# Patient Record
Sex: Male | Born: 1995 | Race: White | Hispanic: No | Marital: Single | Smoking: Current every day smoker
Health system: Southern US, Community
[De-identification: ages and names within clinical notes are randomized; demographics above are authoritative.]

## PROBLEM LIST (undated history)

## (undated) DIAGNOSIS — F4310 Post-traumatic stress disorder, unspecified: Secondary | ICD-10-CM

---
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
## 2017-05-14 ENCOUNTER — Encounter: Payer: Self-pay | Admitting: Emergency Medicine

## 2017-05-14 ENCOUNTER — Emergency Department
Admission: EM | Admit: 2017-05-14 | Discharge: 2017-05-14 | Disposition: A | Discharge status: Home / Self Care | Payer: Commercial Managed Care - PPO | Attending: Emergency Medicine | Admitting: Emergency Medicine

## 2017-05-14 ENCOUNTER — Other Ambulatory Visit: Payer: Self-pay

## 2017-05-14 ENCOUNTER — Emergency Department: Payer: Commercial Managed Care - PPO

## 2017-05-14 DIAGNOSIS — Y929 Unspecified place or not applicable: Secondary | ICD-10-CM | POA: Diagnosis not present

## 2017-05-14 DIAGNOSIS — X501XXA Overexertion from prolonged static or awkward postures, initial encounter: Secondary | ICD-10-CM | POA: Diagnosis not present

## 2017-05-14 DIAGNOSIS — Y999 Unspecified external cause status: Secondary | ICD-10-CM | POA: Diagnosis not present

## 2017-05-14 DIAGNOSIS — S82892A Other fracture of left lower leg, initial encounter for closed fracture: Secondary | ICD-10-CM | POA: Insufficient documentation

## 2017-05-14 DIAGNOSIS — Y939 Activity, unspecified: Secondary | ICD-10-CM | POA: Insufficient documentation

## 2017-05-14 DIAGNOSIS — S99912A Unspecified injury of left ankle, initial encounter: Secondary | ICD-10-CM | POA: Diagnosis present

## 2017-05-14 HISTORY — DX: Post-traumatic stress disorder, unspecified: F43.10

## 2017-05-14 MED ORDER — OXYCODONE-ACETAMINOPHEN 7.5-325 MG PO TABS: 1.0000 | ORAL_TABLET | Freq: Four times a day (QID) | ORAL | 0 refills | Status: AC | PRN

## 2017-05-14 MED ORDER — NAPROXEN 500 MG PO TABS: 500.0000 mg | ORAL_TABLET | Freq: Two times a day (BID) | ORAL | Status: AC

## 2017-05-14 MED ORDER — OXYCODONE-ACETAMINOPHEN 5-325 MG PO TABS
1.0000 | ORAL_TABLET | Freq: Once | ORAL | Status: AC
  Administered 2017-05-14: 1 via ORAL
  Filled 2017-05-14: qty 1

## 2017-05-14 MED ORDER — IBUPROFEN 800 MG PO TABS
800.0000 mg | ORAL_TABLET | Freq: Once | ORAL | Status: AC
  Administered 2017-05-14: 800 mg via ORAL
  Filled 2017-05-14: qty 1

## 2017-05-14 NOTE — ED Triage Notes (Signed)
Pt to ED via POV, pt states that he was standing moving stuff and miss stepped and rolled his ankle. Pt has swelling to the left ankle. Pt in NAD at this time.

## 2017-05-14 NOTE — Discharge Instructions (Signed)
Wear splint and ambulate with crutches until evaluation by orthopedics.  Call in the morning to schedule appointment.

## 2017-05-14 NOTE — ED Notes (Signed)
See triage note states he stepped wrong and rolled his ankle  Swelling noted to left ankle  positive pulses

## 2017-05-14 NOTE — ED Provider Notes (Signed)
[facility] Emergency Department Provider Note   ____________________________________________   First MD Initiated Contact with Patient 05/14/17 1619     (approximate)  I have reviewed the triage vital signs and the nursing notes.   HISTORY  Chief Complaint Ankle Injury    HPI Ryan Brown is a 22 y.o. male patient complain of left ankle pain secondary to missed a step and rolled his ankle prior to arrival.  Patient rates his pain as a 10/10.  Patient described the pain is "aching".  Patient unable to weight-bear secondary to pain and edema.  No palliative measures prior to arrival.   Past Medical History:  Diagnosis Date  . PTSD (post-traumatic stress disorder)     There are no active problems to display for this patient.   History reviewed. No pertinent surgical history.  Prior to Admission medications   Medication Sig Start Date End Date Taking? Authorizing Provider  naproxen (NAPROSYN) 500 MG tablet Take 1 tablet (500 mg total) by mouth 2 (two) times daily with a meal. 05/14/17   Joni Reining, PA-C  oxyCODONE-acetaminophen (PERCOCET) 7.5-325 MG tablet Take 1 tablet by mouth every 6 (six) hours as needed for severe pain. 05/14/17   Joni Reining, PA-C    Allergies Patient has no known allergies.  No family history on file.  Social History Social History   Tobacco Use  . Smoking status: Never Smoker  . Smokeless tobacco: Never Used  Substance Use Topics  . Alcohol use: Not Currently  . Drug use: Yes    Types: Marijuana    Review of Systems Constitutional: No fever/chills Eyes: No visual changes. ENT: No sore throat. Cardiovascular: Denies chest pain. Respiratory: Denies shortness of breath. Gastrointestinal: No abdominal pain.  No nausea, no vomiting.  No diarrhea.  No constipation. Genitourinary: Negative for dysuria. Musculoskeletal: Left lateral ankle pain Skin: Negative for rash. Neurological: Negative for  headaches, focal weakness or numbness.   ____________________________________________   PHYSICAL EXAM:  VITAL SIGNS: ED Triage Vitals  Enc Vitals Group     BP 05/14/17 1607 114/73     Pulse Rate 05/14/17 1607 89     Resp 05/14/17 1607 16     Temp 05/14/17 1607 (!) 97.4 F (36.3 C)     Temp Source 05/14/17 1607 Oral     SpO2 05/14/17 1607 97 %     Weight --      Height --      Head Circumference --      Peak Flow --      Pain Score 05/14/17 1609 10     Pain Loc --      Pain Edu? --      Excl. in GC? --    Constitutional: Alert and oriented. Well appearing and in no acute distress. Hematological/Lymphatic/Immunilogical No cervical lymphadenopathy. Cardiovascular: Normal rate, regular rhythm. Grossly normal heart sounds.  Good peripheral circulation. Respiratory: Normal respiratory effort.  No retractions. Lungs CTAB. Musculoskeletal: No obvious deformity to the left ankle.  Moderate edema to the lateral malleolus.  Patient refused weightbearing secondary to pain. Neurologic:  Normal speech and language. No gross focal neurologic deficits are appreciated. No gait instability. Skin:  Skin is warm, dry and intact. No rash noted. Psychiatric: Mood and affect are normal. Speech and behavior are normal.  ____________________________________________   LABS (all labs ordered are listed, but only abnormal results are displayed)  Labs Reviewed - No data to display ____________________________________________  EKG   ____________________________________________  RADIOLOGY    Official radiology report(s): Dg Ankle Complete Left  Result Date: 05/14/2017 CLINICAL DATA:  Ankle injury while moving furniture EXAM: LEFT ANKLE COMPLETE - 3+ VIEW COMPARISON:  None. FINDINGS: Lucency through the anterior aspect of the distal fibula is seen only on the lateral projection, possibly indicating a minimally displaced fracture. There is severe lateral softtissue swelling. No tibial  fracture. IMPRESSION: Severe lateral left ankle soft tissue swelling with suspected nondisplaced fracture of the distal fibula. CT would be confirmatory. Electronically Signed   By: Deatra RobinsonKevin  Herman M.D.   On: 05/14/2017 17:06    ____________________________________________   PROCEDURES  Procedure(s) performed: None  Procedures  Critical Care performed: No  ____________________________________________   INITIAL IMPRESSION / ASSESSMENT AND PLAN / ED COURSE  As part of my medical decision making, I reviewed the following data within the electronic MEDICAL RECORD NUMBER    Patient presents for left ankle pain secondary to "rolling ankle" prior to arrival.  X-ray findings per radiologist suggests of a distal nondisplaced fibular fracture.  Discussed x-ray findings with patient.  Patient placed in a short leg ankle splint and given crutches for ambulation.  Patient given prescription for Percocet and naproxen.  Advised patient follow-up on-call orthopedic department in the morning to schedule appointment for definitive evaluation and treatment.      ____________________________________________   FINAL CLINICAL IMPRESSION(S) / ED DIAGNOSES  Final diagnoses:  Closed fracture of left ankle, initial encounter     ED Discharge Orders        Ordered    oxyCODONE-acetaminophen (PERCOCET) 7.5-325 MG tablet  Every 6 hours PRN     05/14/17 1726    naproxen (NAPROSYN) 500 MG tablet  2 times daily with meals     05/14/17 1726       Note:  This document was prepared using Dragon voice recognition software and may include unintentional dictation errors.    Joni ReiningSmith, Ronald K, PA-C 05/14/17 1732    Phineas SemenGoodman, Graydon, MD 05/14/17 [phone]

## 2018-04-12 ENCOUNTER — Other Ambulatory Visit: Payer: Self-pay

## 2018-04-12 ENCOUNTER — Emergency Department
Admission: EM | Admit: 2018-04-12 | Discharge: 2018-04-12 | Disposition: A | Discharge status: Home / Self Care | Payer: Commercial Managed Care - PPO | Attending: Emergency Medicine | Admitting: Emergency Medicine

## 2018-04-12 ENCOUNTER — Emergency Department: Payer: Commercial Managed Care - PPO

## 2018-04-12 ENCOUNTER — Encounter: Payer: Self-pay | Admitting: Emergency Medicine

## 2018-04-12 DIAGNOSIS — Y929 Unspecified place or not applicable: Secondary | ICD-10-CM | POA: Diagnosis not present

## 2018-04-12 DIAGNOSIS — S93401A Sprain of unspecified ligament of right ankle, initial encounter: Secondary | ICD-10-CM | POA: Insufficient documentation

## 2018-04-12 DIAGNOSIS — Y999 Unspecified external cause status: Secondary | ICD-10-CM | POA: Insufficient documentation

## 2018-04-12 DIAGNOSIS — Z79899 Other long term (current) drug therapy: Secondary | ICD-10-CM | POA: Insufficient documentation

## 2018-04-12 DIAGNOSIS — F17200 Nicotine dependence, unspecified, uncomplicated: Secondary | ICD-10-CM | POA: Diagnosis not present

## 2018-04-12 DIAGNOSIS — Y9301 Activity, walking, marching and hiking: Secondary | ICD-10-CM | POA: Insufficient documentation

## 2018-04-12 DIAGNOSIS — S99911A Unspecified injury of right ankle, initial encounter: Secondary | ICD-10-CM | POA: Diagnosis present

## 2018-04-12 DIAGNOSIS — W010XXA Fall on same level from slipping, tripping and stumbling without subsequent striking against object, initial encounter: Secondary | ICD-10-CM | POA: Diagnosis not present

## 2018-04-12 DIAGNOSIS — S93601A Unspecified sprain of right foot, initial encounter: Secondary | ICD-10-CM | POA: Diagnosis not present

## 2018-04-12 MED ORDER — BACITRACIN-NEOMYCIN-POLYMYXIN 400-5-5000 EX OINT
TOPICAL_OINTMENT | Freq: Once | CUTANEOUS | Status: AC
  Administered 2018-04-12: 19:00:00 via TOPICAL

## 2018-04-12 MED ORDER — NAPROXEN 500 MG PO TABS
500.0000 mg | ORAL_TABLET | Freq: Once | ORAL | Status: AC
  Administered 2018-04-12: 500 mg via ORAL
  Filled 2018-04-12: qty 1

## 2018-04-12 MED ORDER — TRAMADOL HCL 50 MG PO TABS: 50.0000 mg | ORAL_TABLET | Freq: Four times a day (QID) | ORAL | 0 refills | Status: AC | PRN

## 2018-04-12 MED ORDER — IBUPROFEN 600 MG PO TABS: 600.0000 mg | ORAL_TABLET | Freq: Three times a day (TID) | ORAL | 0 refills | Status: AC | PRN

## 2018-04-12 MED ORDER — BACITRACIN-NEOMYCIN-POLYMYXIN 400-5-5000 EX OINT
TOPICAL_OINTMENT | CUTANEOUS | Status: AC
  Filled 2018-04-12: qty 1

## 2018-04-12 MED ORDER — BACITRACIN ZINC 500 UNIT/GM EX OINT: TOPICAL_OINTMENT | Freq: Once | CUTANEOUS | Status: DC

## 2018-04-12 NOTE — ED Triage Notes (Signed)
First Nurse Note:  Patient states he was walking his dog and his dog tripped him and now he is having foot pain.  Patient ambulatory with a cane.

## 2018-04-12 NOTE — ED Triage Notes (Signed)
Pt presents with right foot and ankle pain after a fall when walking his dog. Pt also has abrasion to right knee.

## 2018-04-12 NOTE — ED Provider Notes (Signed)
[facility] Emergency Department Provider Note   ____________________________________________   First MD Initiated Contact with Patient 04/12/18 1805     (approximate)  I have reviewed the triage vital signs and the nursing notes.   HISTORY  Chief Complaint Ankle Pain    HPI Ryan Brown is a 23 y.o. male patient presents with right foot and ankle pain secondary to a trip and fall while walking his dog.  Patient also abrasion to the right knee.  Patient is able to weight-bear.  Patient rates his pain as a 7/10.  Patient described the pain is "achy".  No palliative measures for complaint.    Past Medical History:  Diagnosis Date  . PTSD (post-traumatic stress disorder)     There are no active problems to display for this patient.   History reviewed. No pertinent surgical history.  Prior to Admission medications   Medication Sig Start Date End Date Taking? Authorizing Provider  ibuprofen (ADVIL,MOTRIN) 600 MG tablet Take 1 tablet (600 mg total) by mouth every 8 (eight) hours as needed. 04/12/18   Joni Reining, PA-C  naproxen (NAPROSYN) 500 MG tablet Take 1 tablet (500 mg total) by mouth 2 (two) times daily with a meal. 05/14/17   Joni Reining, PA-C  oxyCODONE-acetaminophen (PERCOCET) 7.5-325 MG tablet Take 1 tablet by mouth every 6 (six) hours as needed for severe pain. 05/14/17   Joni Reining, PA-C  traMADol (ULTRAM) 50 MG tablet Take 1 tablet (50 mg total) by mouth every 6 (six) hours as needed. 04/12/18   Joni Reining, PA-C    Allergies Patient has no known allergies.  History reviewed. No pertinent family history.  Social History Social History   Tobacco Use  . Smoking status: Current Every Day Smoker    Packs/day: 0.50  . Smokeless tobacco: Never Used  Substance Use Topics  . Alcohol use: Yes    Alcohol/week: 7.0 standard drinks    Types: 7 Cans of beer per week  . Drug use: Not Currently    Types: Marijuana   Comment: occasional    Review of Systems  Constitutional: No fever/chills Eyes: No visual changes. ENT: No sore throat. Cardiovascular: Denies chest pain. Respiratory: Denies shortness of breath. Gastrointestinal: No abdominal pain.  No nausea, no vomiting.  No diarrhea.  No constipation. Genitourinary: Negative for dysuria. Musculoskeletal: Right ankle and foot pain. Skin: Negative for rash. Neurological: Negative for headaches, focal weakness or numbness. Psychiatric:  PTSD. ____________________________________________   PHYSICAL EXAM:  VITAL SIGNS: ED Triage Vitals  Enc Vitals Group     BP      Pulse      Resp      Temp      Temp src      SpO2      Weight      Height      Head Circumference      Peak Flow      Pain Score      Pain Loc      Pain Edu?      Excl. in GC?    Constitutional: Alert and oriented. Well appearing and in no acute distress.  Morbid obesity. Neck: No stridor.  No cervical spine tenderness to palpation. Hematological/Lymphatic/Immunilogical: No cervical lymphadenopathy. Cardiovascular: Normal rate, regular rhythm. Grossly normal heart sounds.  Good peripheral circulation. Respiratory: Normal respiratory effort.  No retractions. Lungs CTAB. Gastrointestinal: Soft and nontender. No distention. No abdominal bruits. No CVA tenderness. Musculoskeletal: No obvious  deformity to the right ankle/foot.  Patient is moderate guarding palpation lateral aspect of the right foot.   Neurologic:  Normal speech and language. No gross focal neurologic deficits are appreciated. No gait instability. Skin:  Skin is warm, dry and intact. No rash noted. Psychiatric: Mood and affect are normal. Speech and behavior are normal.  ____________________________________________   LABS (all labs ordered are listed, but only abnormal results are displayed)  Labs Reviewed - No data to  display ____________________________________________  EKG   ____________________________________________  RADIOLOGY  ED MD interpretation:    Official radiology report(s): Dg Foot Complete Right  Result Date: 04/12/2018 CLINICAL DATA:  Foot pain after falling. EXAM: RIGHT FOOT COMPLETE - 3+ VIEW COMPARISON:  12/13/2008 FINDINGS: There is no evidence of fracture or dislocation. There is no evidence of arthropathy or other focal bone abnormality. Soft tissues are unremarkable. IMPRESSION: Negative. Electronically Signed   By: Kennith Center M.D.   On: 04/12/2018 18:44    ____________________________________________   PROCEDURES  Procedure(s) performed (including Critical Care):  Procedures   ____________________________________________   INITIAL IMPRESSION / ASSESSMENT AND PLAN / ED COURSE  As part of my medical decision making, I reviewed the following data within the electronic MEDICAL RECORD NUMBER     Right foot and ankle pain secondary to sprain.  Discussed x-ray findings with patient.  Patient placed in ankle stirrup and postop shoe.  Patient given discharge care instruction advised take medication as directed.  [facility]      ____________________________________________   FINAL CLINICAL IMPRESSION(S) / ED DIAGNOSES  Final diagnoses:  Sprain of right ankle, unspecified ligament, initial encounter  Sprain of right foot, initial encounter     ED Discharge Orders         Ordered    traMADol (ULTRAM) 50 MG tablet  Every 6 hours PRN     04/12/18 1855    ibuprofen (ADVIL,MOTRIN) 600 MG tablet  Every 8 hours PRN     04/12/18 1855           Note:  This document was prepared using Dragon voice recognition software and may include unintentional dictation errors.    Joni Reining, PA-C 04/12/18 1901    Dionne Bucy, MD 04/12/18 [phone]

## 2020-05-13 IMAGING — DX DG FOOT COMPLETE 3+V*R*
3 series · 3 of 3 positions shown · non-contrast
Comparison: 12/13/2008

CLINICAL DATA: Foot pain after falling.

EXAM:
RIGHT FOOT COMPLETE - 3+ VIEW

[foot ap]
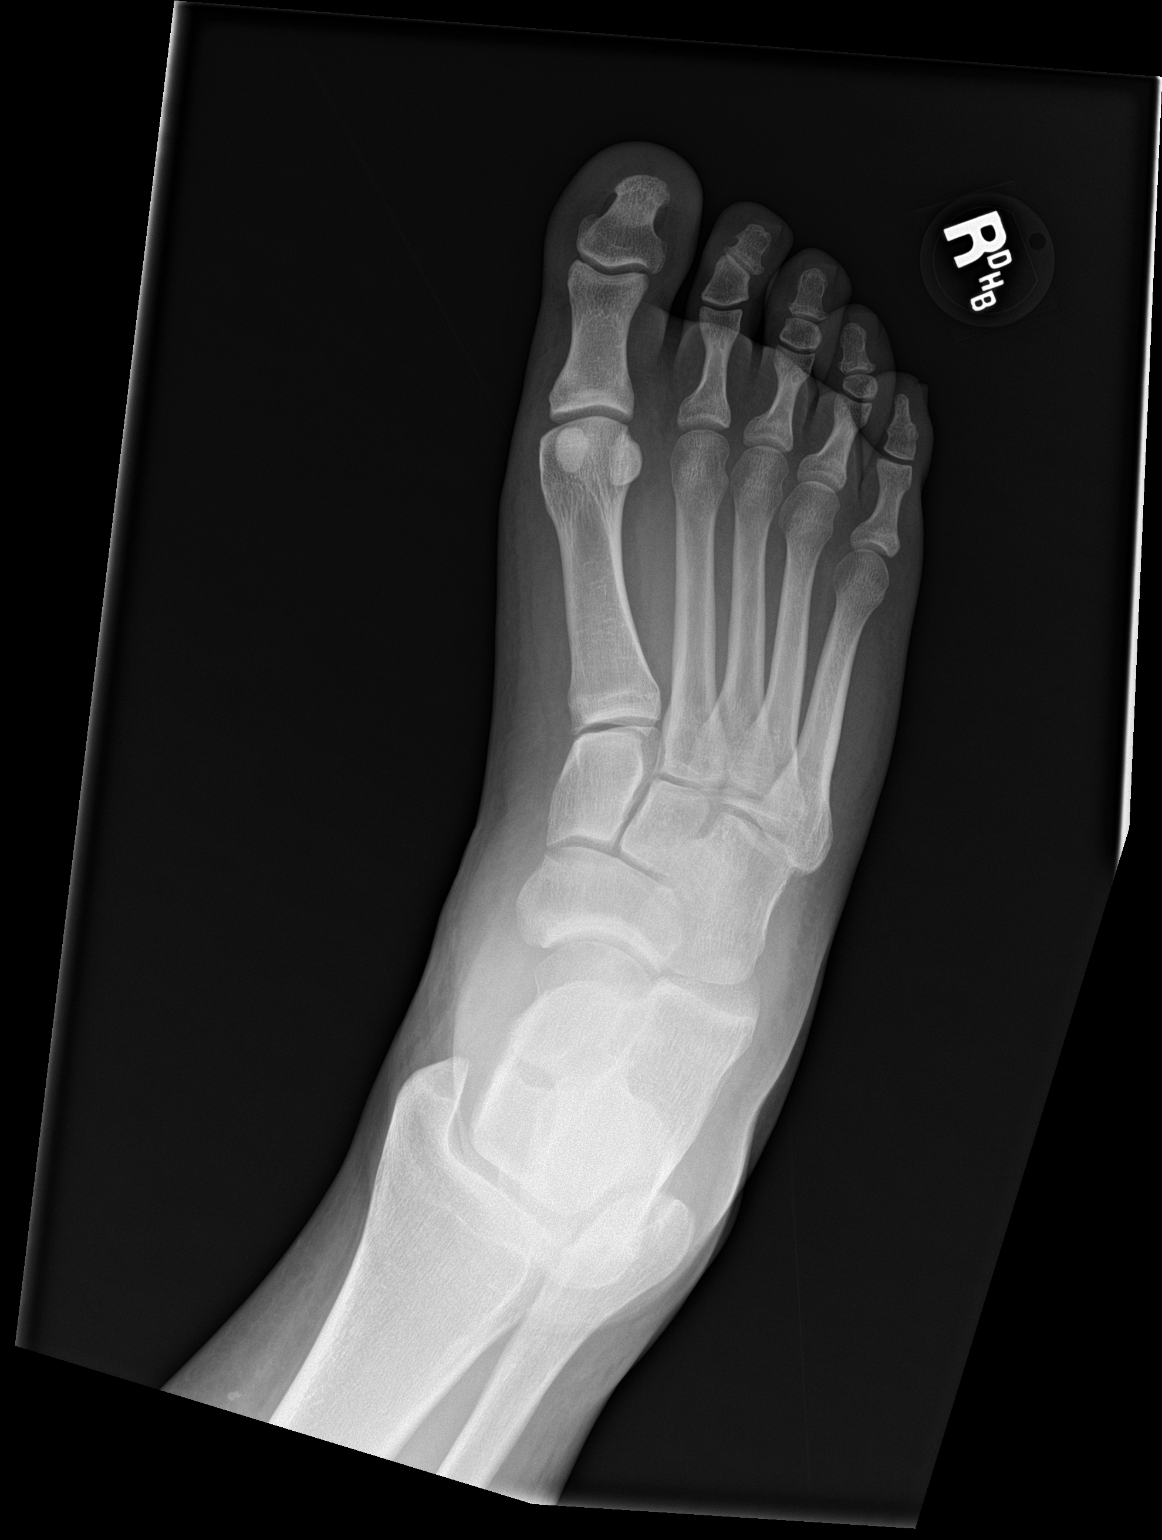

[foot obl]
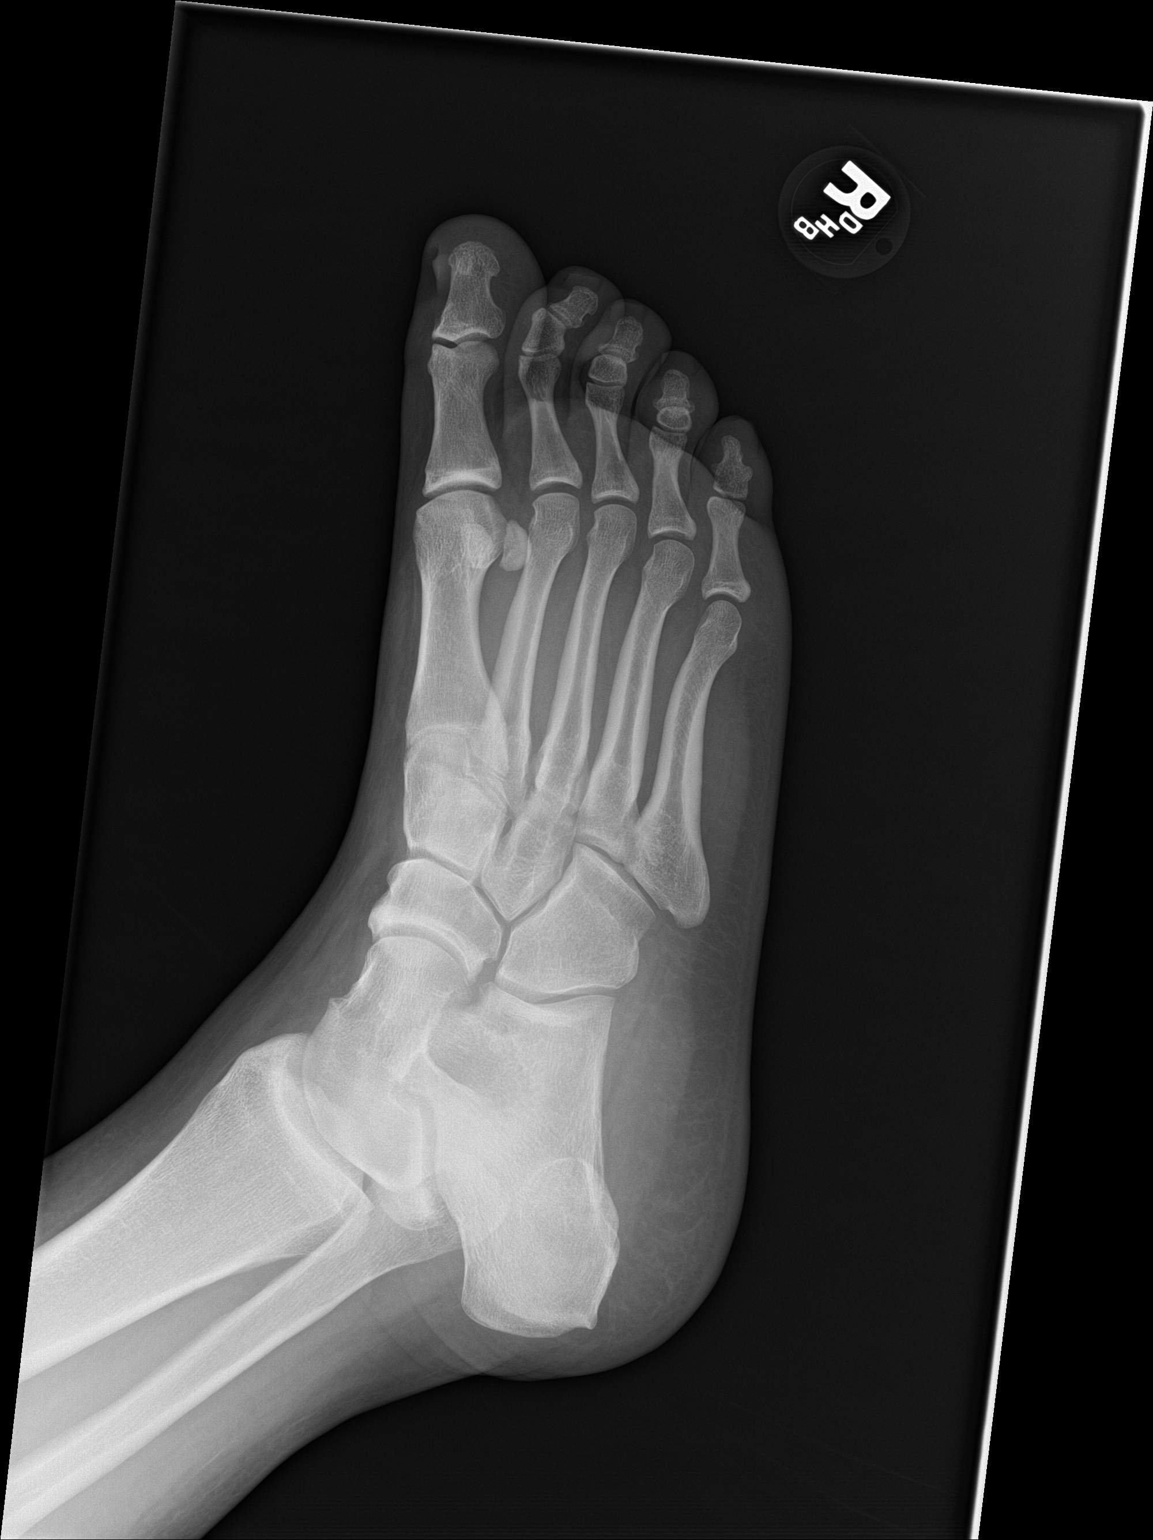

[foot lat]
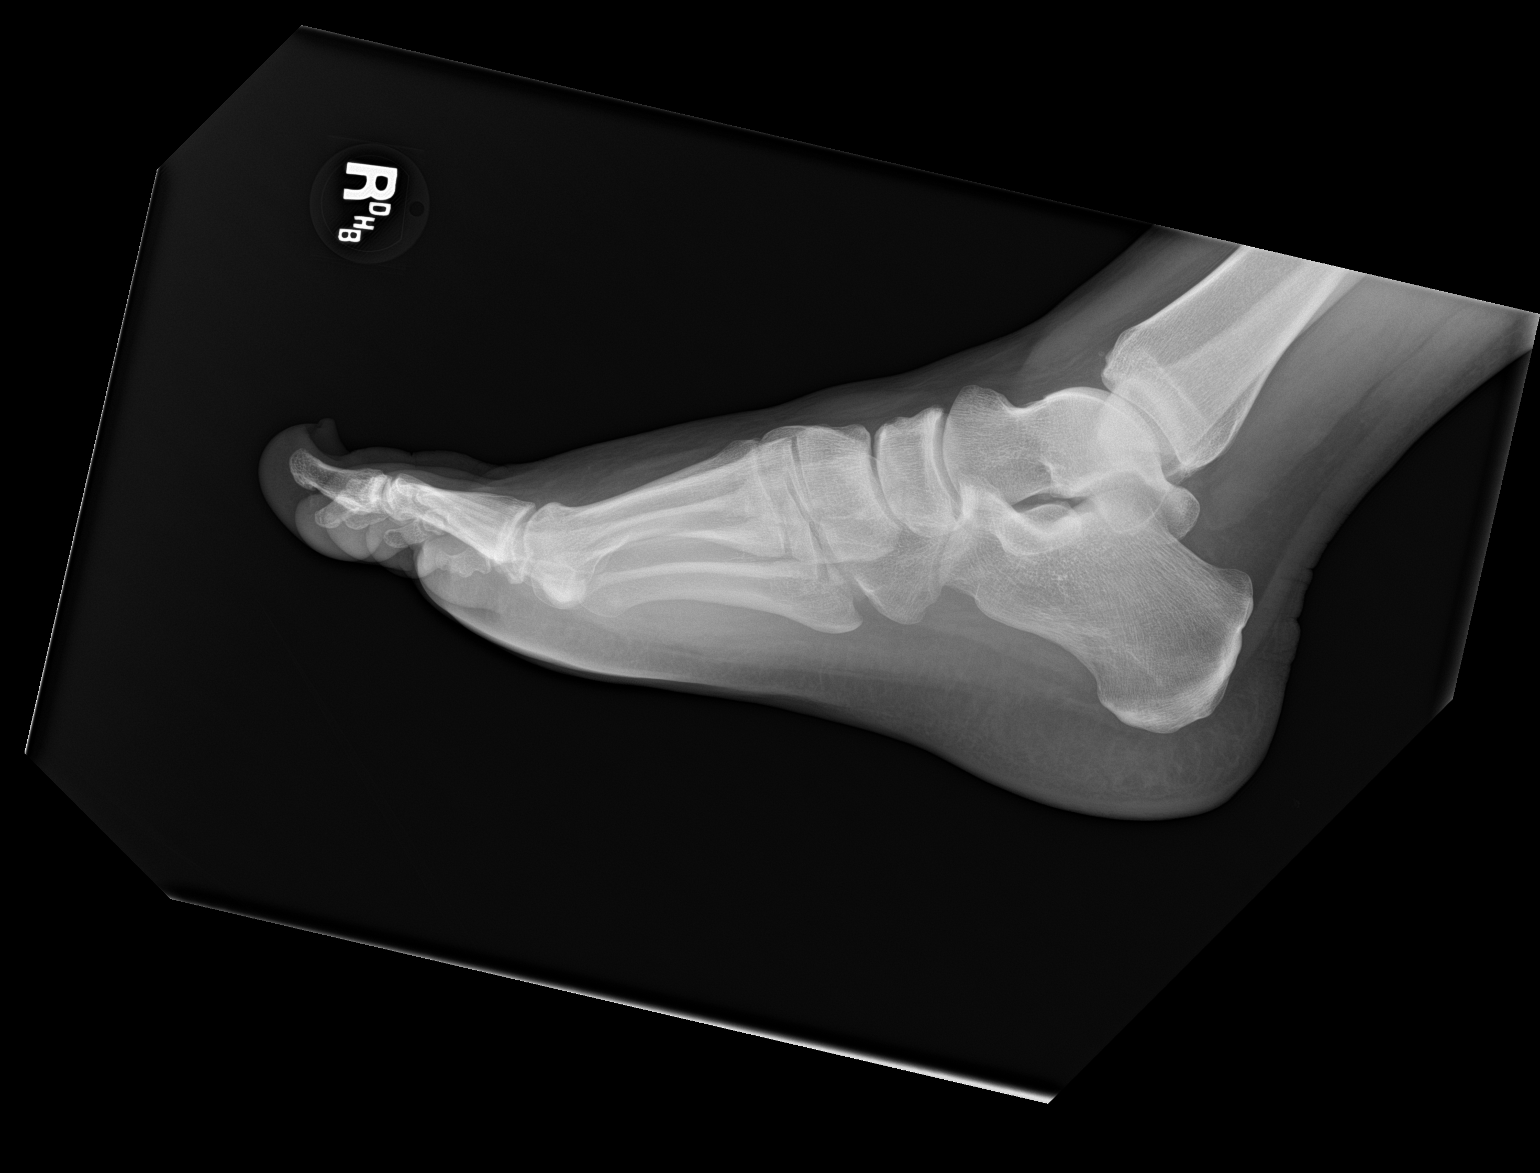

[3 of 3 positions shown; findings below may reference images not displayed]

FINDINGS: There is no evidence of fracture or dislocation. There is no
evidence of arthropathy or other focal bone abnormality. Soft
tissues are unremarkable.
IMPRESSION: Negative.

## 2022-08-03 ENCOUNTER — Other Ambulatory Visit: Payer: Self-pay

## 2022-08-03 ENCOUNTER — Emergency Department: Payer: Self-pay

## 2022-08-03 ENCOUNTER — Emergency Department
Admission: EM | Admit: 2022-08-03 | Discharge: 2022-08-03 | Disposition: A | Discharge status: Home / Self Care | Payer: Self-pay | Attending: Emergency Medicine | Admitting: Emergency Medicine

## 2022-08-03 DIAGNOSIS — M25572 Pain in left ankle and joints of left foot: Secondary | ICD-10-CM | POA: Insufficient documentation

## 2022-08-03 DIAGNOSIS — M25472 Effusion, left ankle: Secondary | ICD-10-CM | POA: Insufficient documentation

## 2022-08-03 MED ORDER — MELOXICAM 15 MG PO TABS: 15.0000 mg | ORAL_TABLET | Freq: Every day | ORAL | 0 refills | Status: AC

## 2022-08-03 MED ORDER — ACETAMINOPHEN 500 MG PO TABS
1000.0000 mg | ORAL_TABLET | Freq: Once | ORAL | Status: AC
  Administered 2022-08-03: 1000 mg via ORAL
  Filled 2022-08-03: qty 2

## 2022-08-03 NOTE — ED Notes (Signed)
See triage note. Patient came in due to working 12 hour shifts and this is the second time his ankle has swollen up after being on his feet for the entire 12 hours at work. Rating pain 10/10 and stabbing pain.

## 2022-08-03 NOTE — ED Notes (Signed)
Patient given CAM boot L and crutches M at this time. Paperwork filled out also.

## 2022-08-03 NOTE — ED Provider Notes (Signed)
[facility] Provider Note    Event Date/Time   First MD Initiated Contact with Patient 08/03/22 1320     (approximate)   History   Ankle Pain   HPI  Ryan Brown is a 27 y.o. male with PMH of a closed fracture of the left ankle who presents for left ankle pain x 1 week.  Patient states that his pain became worse last night which is what brought him in this morning.  He has been wearing a walking boot that he borrowed from a friend which has helped a little bit.  Patient has been taking ibuprofen at home but does not get sufficient pain relief.  Patient is an International aid/development worker at [employer] and states he has been working a lot in the last few days.  He works 12-hour shifts and is standing majority of the time.  He denies any specific injury to the ankle.     Physical Exam   Triage Vital Signs: ED Triage Vitals  Enc Vitals Group     BP 08/03/22 1223 (!) 132/96     Pulse Rate 08/03/22 1223 88     Resp 08/03/22 1223 16     Temp 08/03/22 1223 98.5 F (36.9 C)     Temp Source 08/03/22 1223 Oral     SpO2 08/03/22 1223 97 %     Weight 08/03/22 1224 243 lb (110.2 kg)     Height 08/03/22 1224 5\' 9"  (1.753 m)     Head Circumference --      Peak Flow --      Pain Score 08/03/22 1226 10     Pain Loc --      Pain Edu? --      Excl. in GC? --     Most recent vital signs: Vitals:   08/03/22 1223 08/03/22 1331  BP: (!) 132/96 (!) 91/58  Pulse: 88 65  Resp: 16 17  Temp: 98.5 F (36.9 C) 98.6 F (37 C)  SpO2: 97% 98%    General: Awake, tearful on exam. CV:  Good peripheral perfusion.   Resp:  Normal effort.  Abd:  No distention.  L ankle: Swelling noted when compared to the right ankle, tender to palpation at the joint line as well as surrounding the medial and lateral malleolus, ROM significantly decreased due to pain, patient unable to complete strength testing due to pain. Dorsalis pedis pulse difficult to palpate.  Capillary refill less than  2 seconds.   ED Results / Procedures / Treatments   Labs (all labs ordered are listed, but only abnormal results are displayed) Labs Reviewed - No data to display  RADIOLOGY  Left ankle x-rays obtained in the ED, I interpreted the images as well as reviewed the radiologist report.   PROCEDURES:  Critical Care performed: No  Procedures   MEDICATIONS ORDERED IN ED: Medications  acetaminophen (TYLENOL) tablet 1,000 mg (has no administration in time range)     IMPRESSION / MDM / ASSESSMENT AND PLAN / ED COURSE  I reviewed the triage vital signs and the nursing notes.                              Differential diagnosis includes, but is not limited to, ankle fracture, ankle sprain, arthritis.  Patient's presentation is most consistent with acute complicated illness / injury requiring diagnostic workup.  Patient with history of multiple ankle injuries presents to  the ED for evaluation of ankle pain x 1 week.  On exam patient was exquisitely tender around the ankle joint.  Ankle x-rays were obtained.  I interpreted the images as well as reviewed the radiologist report.  There is no evidence of fracture or dislocation, but multiple sites of degenerative spurring as well as soft tissue edema.  I believe patient's pain is due to an increase in his activity which is caused inflammation in the ankle.  I will give patient a walking boot and crutches for comfort.  I also explained that he needs to ice and elevate his ankle is much as possible.  I will include a work note allowing for breaks so patient can elevate his ankle.  I also instructed patient to follow-up with orthopedics.  I will give patient a prescription for meloxicam and instructed him to take Tylenol if he needs additional pain control.  Patient was agreeable to plan, he voiced understanding and all questions were answered.  Patient was stable at discharge.     FINAL CLINICAL IMPRESSION(S) / ED DIAGNOSES   Final diagnoses:   Acute left ankle pain     Rx / DC Orders   ED Discharge Orders          Ordered    meloxicam (MOBIC) 15 MG tablet  Daily        08/03/22 1415             Note:  This document was prepared using Dragon voice recognition software and may include unintentional dictation errors.   Cameron Ali, PA-C 08/03/22 1416    Chesley Noon, MD 08/03/22 [phone]

## 2022-08-03 NOTE — ED Triage Notes (Signed)
Pt to ED for left ankle pain for 1 week. Worsening pain last night. Reports injury a couple years ago  Tearful in triage

## 2022-10-16 DIAGNOSIS — Z419 Encounter for procedure for purposes other than remedying health state, unspecified: Secondary | ICD-10-CM | POA: Diagnosis not present

## 2022-11-15 DIAGNOSIS — Z419 Encounter for procedure for purposes other than remedying health state, unspecified: Secondary | ICD-10-CM | POA: Diagnosis not present

## 2022-12-16 DIAGNOSIS — Z419 Encounter for procedure for purposes other than remedying health state, unspecified: Secondary | ICD-10-CM | POA: Diagnosis not present

## 2023-01-15 DIAGNOSIS — Z419 Encounter for procedure for purposes other than remedying health state, unspecified: Secondary | ICD-10-CM | POA: Diagnosis not present

## 2023-02-15 DIAGNOSIS — Z419 Encounter for procedure for purposes other than remedying health state, unspecified: Secondary | ICD-10-CM | POA: Diagnosis not present

## 2023-03-18 DIAGNOSIS — Z419 Encounter for procedure for purposes other than remedying health state, unspecified: Secondary | ICD-10-CM | POA: Diagnosis not present

## 2023-04-15 DIAGNOSIS — Z419 Encounter for procedure for purposes other than remedying health state, unspecified: Secondary | ICD-10-CM | POA: Diagnosis not present

## 2023-05-27 DIAGNOSIS — Z419 Encounter for procedure for purposes other than remedying health state, unspecified: Secondary | ICD-10-CM | POA: Diagnosis not present

## 2023-06-25 ENCOUNTER — Other Ambulatory Visit: Payer: Self-pay

## 2023-06-25 ENCOUNTER — Emergency Department: Admission: EM | Admit: 2023-06-25 | Discharge: 2023-06-25 | Disposition: A | Discharge status: Home / Self Care

## 2023-06-25 DIAGNOSIS — E860 Dehydration: Secondary | ICD-10-CM | POA: Diagnosis not present

## 2023-06-25 DIAGNOSIS — U071 COVID-19: Secondary | ICD-10-CM | POA: Insufficient documentation

## 2023-06-25 DIAGNOSIS — R112 Nausea with vomiting, unspecified: Secondary | ICD-10-CM

## 2023-06-25 DIAGNOSIS — R197 Diarrhea, unspecified: Secondary | ICD-10-CM | POA: Diagnosis present

## 2023-06-25 LAB — URINALYSIS, ROUTINE W REFLEX MICROSCOPIC
Bilirubin Urine: NEGATIVE
Glucose, UA: NEGATIVE mg/dL
Hgb urine dipstick: NEGATIVE
Ketones, ur: NEGATIVE mg/dL
Leukocytes,Ua: NEGATIVE
Nitrite: NEGATIVE
Protein, ur: NEGATIVE mg/dL
Specific Gravity, Urine: 1.025 (ref 1.005–1.030)
pH: 5 (ref 5.0–8.0)

## 2023-06-25 LAB — COMPREHENSIVE METABOLIC PANEL WITH GFR
ALT: 35 U/L (ref 0–44)
AST: 30 U/L (ref 15–41)
Albumin: 4.2 g/dL (ref 3.5–5.0)
Alkaline Phosphatase: 50 U/L (ref 38–126)
Anion gap: 9 (ref 5–15)
BUN: 11 mg/dL (ref 6–20)
CO2: 21 mmol/L — ABNORMAL LOW (ref 22–32)
Calcium: 8.8 mg/dL — ABNORMAL LOW (ref 8.9–10.3)
Chloride: 106 mmol/L (ref 98–111)
Creatinine, Ser: 0.88 mg/dL (ref 0.61–1.24)
GFR, Estimated: >60 mL/min (ref >60)
Glucose, Bld: 107 mg/dL — ABNORMAL HIGH (ref 70–99)
Potassium: 3.5 mmol/L (ref 3.5–5.1)
Sodium: 136 mmol/L (ref 135–145)
Total Bilirubin: 0.6 mg/dL (ref 0.0–1.2)
Total Protein: 7.7 g/dL (ref 6.5–8.1)

## 2023-06-25 LAB — CBC
HCT: 45.7 % (ref 39.0–52.0)
Hemoglobin: 15.7 g/dL (ref 13.0–17.0)
MCH: 32.9 pg (ref 26.0–34.0)
MCHC: 34.4 g/dL (ref 30.0–36.0)
MCV: 95.8 fL (ref 80.0–100.0)
Platelets: 251 10*3/uL (ref 150–400)
RBC: 4.77 MIL/uL (ref 4.22–5.81)
RDW: 12.5 % (ref 11.5–15.5)
WBC: 9.4 10*3/uL (ref 4.0–10.5)
nRBC: 0 % (ref 0.0–0.2)

## 2023-06-25 LAB — RESP PANEL BY RT-PCR (RSV, FLU A&B, COVID)  RVPGX2
Influenza A by PCR: NEGATIVE
Influenza B by PCR: NEGATIVE
Resp Syncytial Virus by PCR: NEGATIVE
SARS Coronavirus 2 by RT PCR: POSITIVE — AB

## 2023-06-25 LAB — LIPASE, BLOOD: Lipase: 35 U/L (ref 11–51)

## 2023-06-25 LAB — GROUP A STREP BY PCR: Group A Strep by PCR: NOT DETECTED

## 2023-06-25 MED ORDER — ONDANSETRON 4 MG PO TBDP: 4.0000 mg | ORAL_TABLET | Freq: Three times a day (TID) | ORAL | 0 refills | Status: AC | PRN

## 2023-06-25 MED ORDER — ONDANSETRON 4 MG PO TBDP
4.0000 mg | ORAL_TABLET | Freq: Once | ORAL | Status: AC | PRN
  Administered 2023-06-25: 4 mg via ORAL
  Filled 2023-06-25: qty 1

## 2023-06-25 MED ORDER — KETOROLAC TROMETHAMINE 15 MG/ML IJ SOLN
15.0000 mg | Freq: Once | INTRAMUSCULAR | Status: AC
  Administered 2023-06-25: 15 mg via INTRAVENOUS
  Filled 2023-06-25: qty 1

## 2023-06-25 MED ORDER — SODIUM CHLORIDE 0.9 % IV BOLUS
1000.0000 mL | Freq: Once | INTRAVENOUS | Status: AC
  Administered 2023-06-25: 1000 mL via INTRAVENOUS

## 2023-06-25 NOTE — ED Triage Notes (Signed)
 Pt c/o diarrhea and vomiting since 1030. Pt says now he feels really weak and faint. Pt reports 4 episodes of emesis and 5 episodes of emesis. Pt says he is unable to keep down water. Pt denies any sick contacts.

## 2023-06-25 NOTE — ED Provider Notes (Signed)
 [facility] Emergency Department Provider Note     Event Date/Time   First MD Initiated Contact with Patient 06/25/23 1916     (approximate)   History   Emesis and Diarrhea   HPI  Ryan Ryan [name] is Ryan [name] 28 y.o. male with Ryan [name] past medical history of PTSD presents to the ED with acute onset of diarrhea and vomiting that began earlier this morning.  Reports multiple episodes of both and is now feeling weak, lightheaded and " feeling weird".  Patient reports he is unable to tolerate oral intake.  Denies known sick contacts.  Denies chest pain, shortness of breath and abdominal pain.  Also complains of sore throat when he coughs.     Physical Exam   Triage Vital Signs: ED Triage Vitals  Encounter Vitals Group     BP 06/25/23 1756 (!) 143/97     Systolic BP Percentile --      Diastolic BP Percentile --      Pulse Rate 06/25/23 1756 (!) 118     Resp 06/25/23 1756 20     Temp 06/25/23 1756 98.6 F (37 C)     Temp Source 06/25/23 1756 Oral     SpO2 06/25/23 1756 98 %     Weight 06/25/23 1756 268 lb (121.6 kg)     Height 06/25/23 1756 5\' 9"  (1.753 m)     Head Circumference --      Peak Flow --      Pain Score 06/25/23 1755 5     Pain Loc --      Pain Education --      Exclude from Growth Chart --     Most recent vital signs: Vitals:   06/25/23 1756  BP: (!) 143/97  Pulse: (!) 118  Resp: 20  Temp: 98.6 F (37 C)  SpO2: 98%    General: Alert and oriented. INAD.  Skin:  Warm, dry and intact. No rashes or lesions noted. Good skin turgor Head:  NCAT.  Eyes:  PERRLA. EOMI.  Nose:   Mucosa is moist. No rhinorrhea. Throat: Oropharynx clear. No erythema or exudates. Tonsils not enlarged. Uvula is midline. CV:  Good peripheral perfusion. Tachy.  RESP:  Normal effort. LCTAB. No retractions.  ABD:  No distention.  NEURO: Cranial nerves intact. No focal deficits.  Brisk capillary refill.  ED Results / Procedures / Treatments   Labs (all labs  ordered are listed, but only abnormal results are displayed) Labs Reviewed  RESP PANEL BY RT-PCR (RSV, FLU Ryan [name]&B, COVID)  RVPGX2 - Abnormal; Notable for the following components:      Result Value   SARS Coronavirus 2 by RT PCR POSITIVE (*)    All other components within normal limits  COMPREHENSIVE METABOLIC PANEL WITH GFR - Abnormal; Notable for the following components:   CO2 21 (*)    Glucose, Bld 107 (*)    Calcium 8.8 (*)    All other components within normal limits  URINALYSIS, ROUTINE W REFLEX MICROSCOPIC - Abnormal; Notable for the following components:   Color, Urine YELLOW (*)    APPearance CLEAR (*)    All other components within normal limits  GROUP Ryan [name] STREP BY PCR  LIPASE, BLOOD  CBC    No results found.  PROCEDURES:  Critical Care performed:   Procedures  MEDICATIONS ORDERED IN ED: Medications  ondansetron (ZOFRAN-ODT) disintegrating tablet 4 mg (4 mg Oral Given 06/25/23 1759)  sodium chloride 0.9 % bolus 1,000 mL (  1,000 mLs Intravenous New Bag/Given 06/25/23 1949)  ketorolac (TORADOL) 15 MG/ML injection 15 mg (15 mg Intravenous Given 06/25/23 1948)    IMPRESSION / MDM / ASSESSMENT AND PLAN / ED COURSE  I reviewed the triage vital signs and the nursing notes.                                 28 y.o. male presents to the emergency department for evaluation and treatment of acute vomiting and diarrhea. See HPI for further details.   Differential diagnosis includes, but is not limited to viral URI, dehydration, gastroenteritis  Patient's presentation is most consistent with acute complicated illness / injury requiring diagnostic workup.  Patient presents to the ED with flulike symptoms including vomiting and diarrhea resulting in volume depletion and weakness.  No signs of respiratory distress or severe systemic illness.  No red flag signs.  Plan to obtain IV fluids for rehydration and antiemetic.  Will add respiratory panel and rapid strep test given sore throat  history.  Respiratory panel is positive for COVID-19 which is more than likely why patient is initially tachycardic.  Will prescribe outpatient Zofran for nausea.  Encourage clear liquids and advance diet as tolerated.  Strict return precautions discussed.  Discussed isolation and quarantine protocol per CDC guidelines.  Patient verbalized understanding.  Encouraged to follow-up with primary care provider in 1 week.  Patient is in stable condition for out patient management all questions and concerns were addressed during this ED visit.  Work note provided.  FINAL CLINICAL IMPRESSION(S) / ED DIAGNOSES   Final diagnoses:  COVID-19  Dehydration  Nausea vomiting and diarrhea     Rx / DC Orders   ED Discharge Orders     None       Note:  This document was prepared using Dragon voice recognition software and may include unintentional dictation errors.    Ryan Ryan [name], Ryan Maiden A, PA-C 06/25/23 2135    Kandee Orion, MD 06/25/23 2249

## 2023-06-25 NOTE — Discharge Instructions (Addendum)
 You were seen in the emergency department today for a vomiting and diarrhea. Your respiratory panel which includes COVID, RSV and influenza is positive for COVID-19.  You will need to follow protocols from the CDC and quarantine and isolation until symptoms improved.  You are vies to wear a mask when you are out in public and social distance at least 6 feet.    Your rapid strep test is negative.  Your labs are reassuring.  Take tylenol  or ibuprofen  for pain or fever as directed.   Stay hydrated by drinking plenty of fluids to thin mucus. Get adequate amount of sleep and avoid overexertion. Consider a humidifier at night. Warm teas and a spoonful of honey may help reduce cough frequency. Follow up with your primary care provider as needed.   For sore throat use throat lozenges or Chloraseptic spray.  Gargle with warm salt water several times daily

## 2023-06-26 DIAGNOSIS — Z419 Encounter for procedure for purposes other than remedying health state, unspecified: Secondary | ICD-10-CM | POA: Diagnosis not present

## 2023-07-27 DIAGNOSIS — Z419 Encounter for procedure for purposes other than remedying health state, unspecified: Secondary | ICD-10-CM | POA: Diagnosis not present

## 2023-07-31 DIAGNOSIS — S93401A Sprain of unspecified ligament of right ankle, initial encounter: Secondary | ICD-10-CM | POA: Diagnosis not present

## 2023-07-31 DIAGNOSIS — X509XXA Other and unspecified overexertion or strenuous movements or postures, initial encounter: Secondary | ICD-10-CM | POA: Diagnosis not present

## 2023-07-31 DIAGNOSIS — M25571 Pain in right ankle and joints of right foot: Secondary | ICD-10-CM | POA: Diagnosis not present

## 2023-08-26 DIAGNOSIS — Z419 Encounter for procedure for purposes other than remedying health state, unspecified: Secondary | ICD-10-CM | POA: Diagnosis not present

## 2023-09-26 DIAGNOSIS — Z419 Encounter for procedure for purposes other than remedying health state, unspecified: Secondary | ICD-10-CM | POA: Diagnosis not present

## 2023-10-27 DIAGNOSIS — Z419 Encounter for procedure for purposes other than remedying health state, unspecified: Secondary | ICD-10-CM | POA: Diagnosis not present

## 2023-11-26 DIAGNOSIS — Z419 Encounter for procedure for purposes other than remedying health state, unspecified: Secondary | ICD-10-CM | POA: Diagnosis not present

## 2023-12-18 DIAGNOSIS — R198 Other specified symptoms and signs involving the digestive system and abdomen: Secondary | ICD-10-CM | POA: Diagnosis not present

## 2023-12-18 DIAGNOSIS — L089 Local infection of the skin and subcutaneous tissue, unspecified: Secondary | ICD-10-CM | POA: Diagnosis not present

## 2023-12-27 DIAGNOSIS — Z419 Encounter for procedure for purposes other than remedying health state, unspecified: Secondary | ICD-10-CM | POA: Diagnosis not present

## 2024-01-19 DIAGNOSIS — R059 Cough, unspecified: Secondary | ICD-10-CM | POA: Diagnosis not present

## 2024-01-19 DIAGNOSIS — B349 Viral infection, unspecified: Secondary | ICD-10-CM | POA: Diagnosis not present

## 2024-01-26 DIAGNOSIS — Z419 Encounter for procedure for purposes other than remedying health state, unspecified: Secondary | ICD-10-CM | POA: Diagnosis not present
